# Patient Record
Sex: Female | Born: 1995 | Race: White | Marital: Single | Smoking: Former smoker
Health system: Southern US, Community
[De-identification: ages and names within clinical notes are randomized; demographics above are authoritative.]

## PROBLEM LIST (undated history)

## (undated) HISTORY — PX: NO PAST SURGERIES: SHX2092

---

## 2020-03-13 ENCOUNTER — Other Ambulatory Visit (HOSPITAL_COMMUNITY): Payer: Self-pay | Admitting: Endocrinology

## 2020-03-13 ENCOUNTER — Other Ambulatory Visit: Payer: Self-pay

## 2020-03-13 ENCOUNTER — Ambulatory Visit
Admission: RE | Admit: 2020-03-13 | Discharge: 2020-03-13 | Disposition: A | Discharge status: Home / Self Care | Payer: 59 | Source: Ambulatory Visit | Attending: Family Medicine | Admitting: Family Medicine

## 2020-03-13 ENCOUNTER — Other Ambulatory Visit (HOSPITAL_COMMUNITY): Payer: Self-pay | Admitting: Family Medicine

## 2020-03-13 ENCOUNTER — Other Ambulatory Visit: Payer: Self-pay | Admitting: Family Medicine

## 2020-03-13 DIAGNOSIS — R1031 Right lower quadrant pain: Secondary | ICD-10-CM | POA: Insufficient documentation

## 2020-03-13 DIAGNOSIS — K3580 Unspecified acute appendicitis: Secondary | ICD-10-CM | POA: Diagnosis not present

## 2020-03-13 IMAGING — CT CT ABD-PELV W/ CM
2 of 4 series · 15 of 46 positions shown, 17 images · IV contrast (APPLIED)
Comparison: None.

CLINICAL DATA: Left lower quadrant abdominal pain for 1 month.

EXAM:
CT ABDOMEN AND PELVIS WITH CONTRAST
TECHNIQUE: Multidetector CT imaging of the abdomen and pelvis was performed
using the standard protocol following bolus administration of
intravenous contrast.
CONTRAST:  75mL OMNIPAQUE IOHEXOL 300 MG/ML  SOLN

[Series 2: axial st · axial · 0.61mm/px · z∈[-853,-478]mm · 12 of 83 slices shown, 14 images]
[im 4/83  soft-tissue]
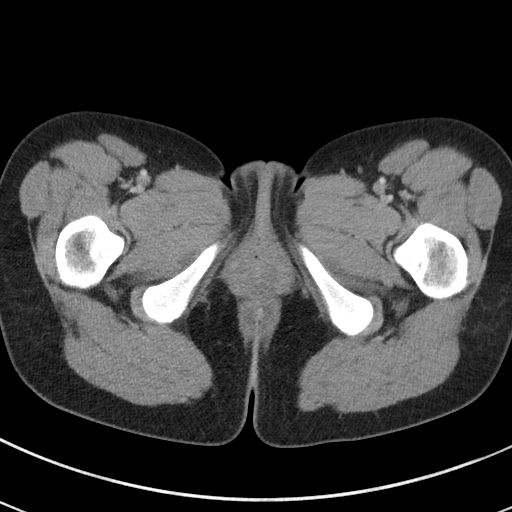
[im 4/83  bone]
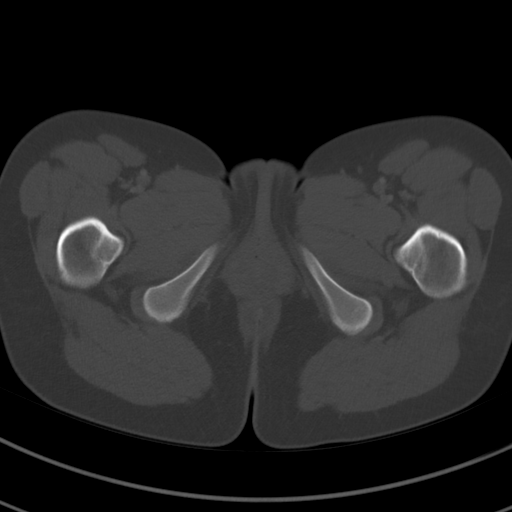
[im 11/83  soft-tissue]
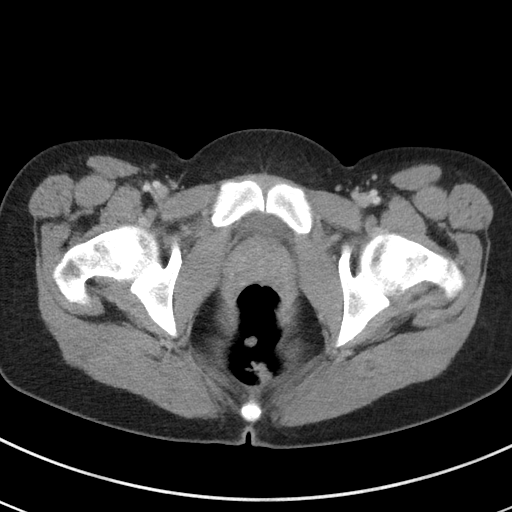
[im 18/83  soft-tissue]
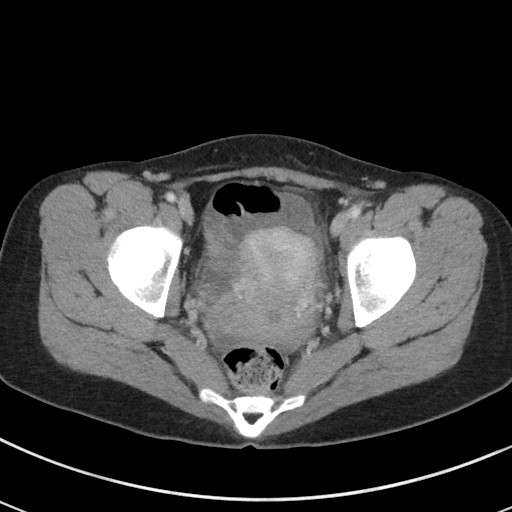
[im 24/83  soft-tissue]
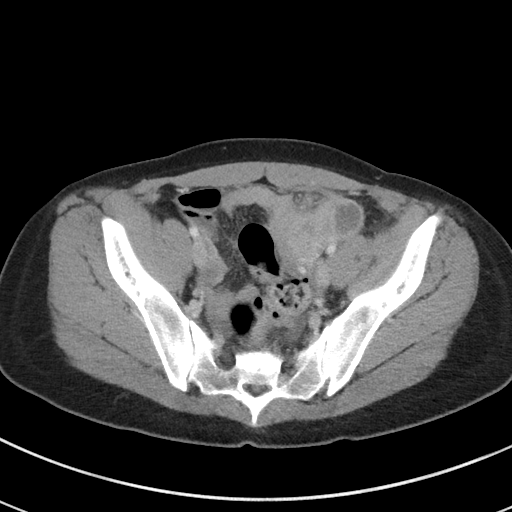
[im 31/83  soft-tissue]
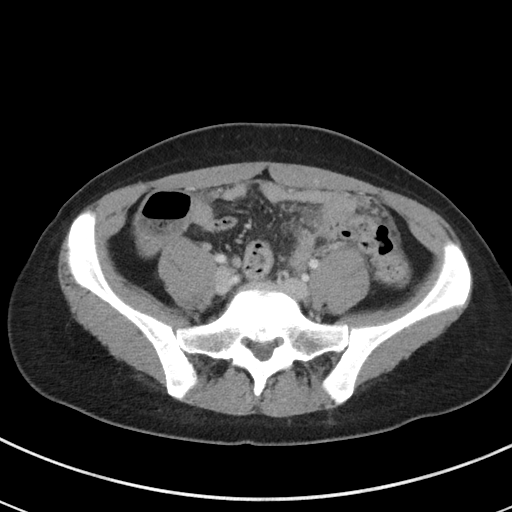
[im 38/83  soft-tissue]
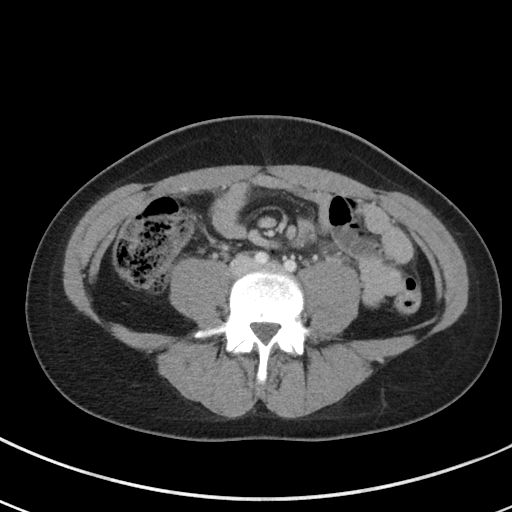
[im 45/83  soft-tissue]
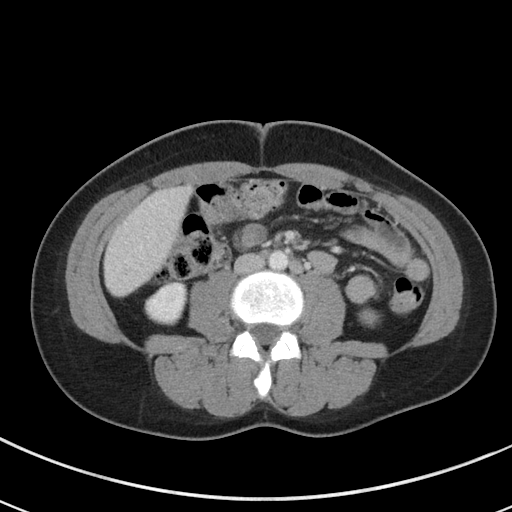
[im 52/83  soft-tissue]
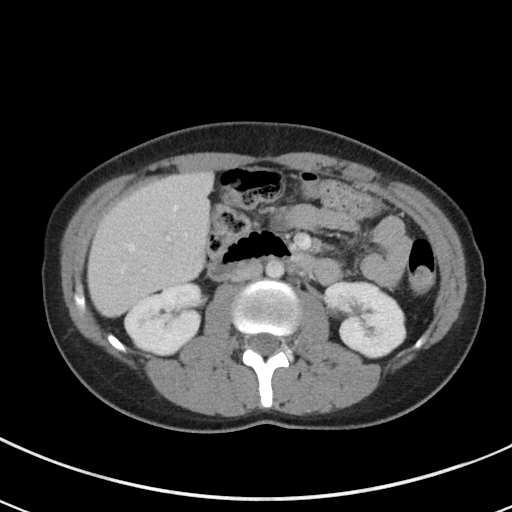
[im 59/83  soft-tissue]
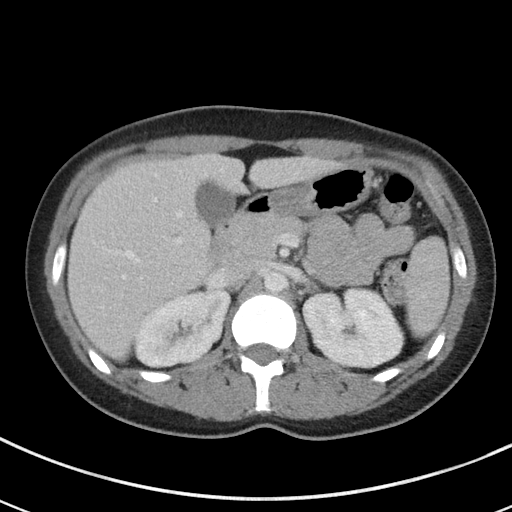
[im 59/83  bone]
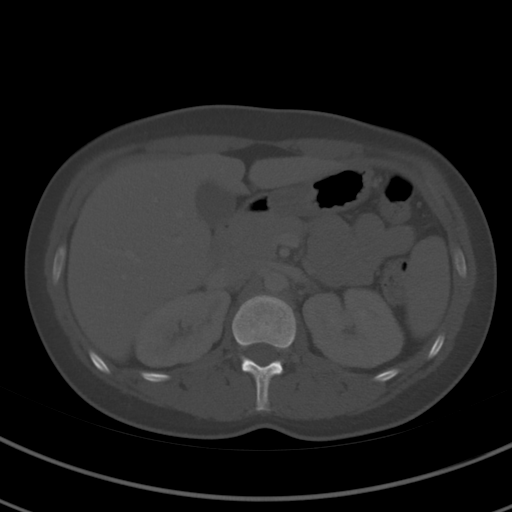
[im 65/83  soft-tissue]
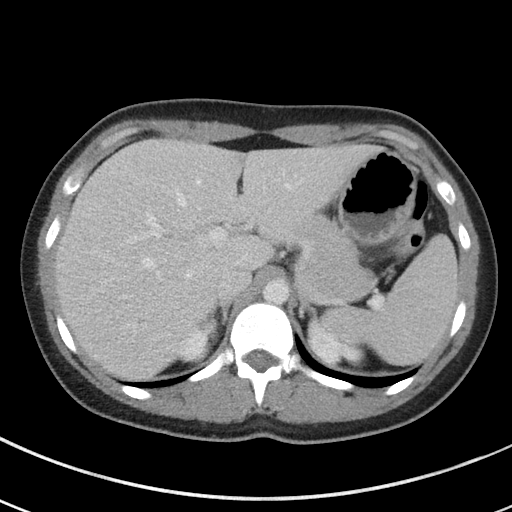
[im 72/83  soft-tissue]
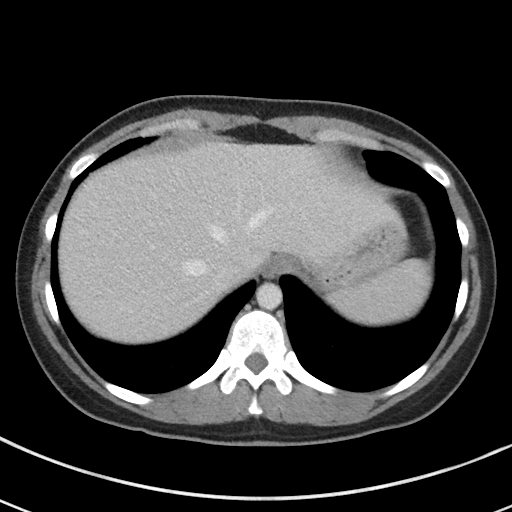
[im 79/83  soft-tissue]
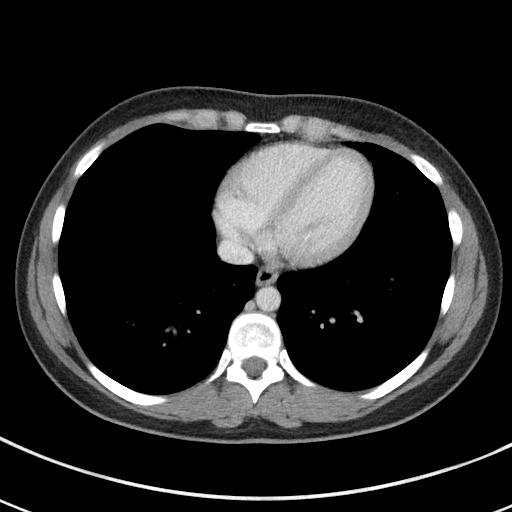

[Series 6: coronal st · coronal · 0.66mm/px · 3 of 75 slices shown]
[im 25/75  soft-tissue]
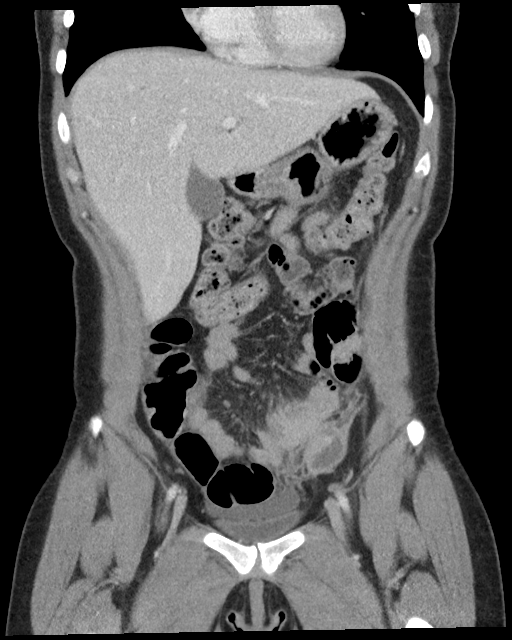
[im 33/75  soft-tissue]
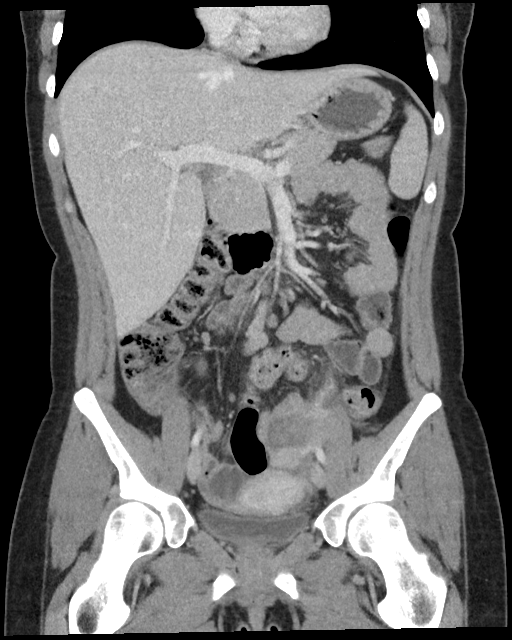
[im 42/75  soft-tissue]
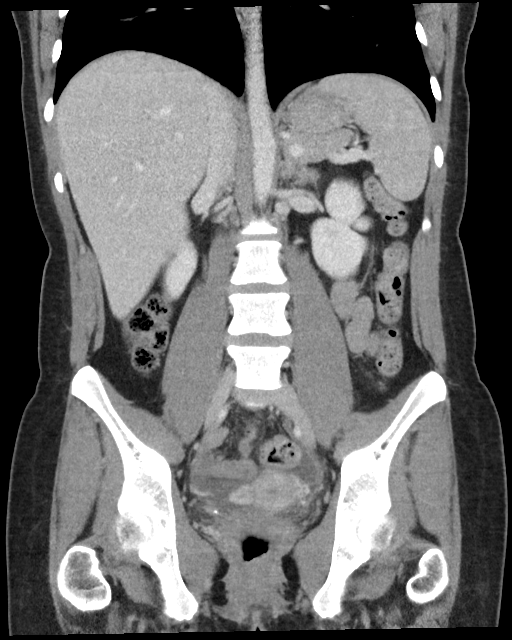

[15 of 46 positions shown; findings below may reference images not displayed]

FINDINGS: Lower chest: The lung bases are clear of acute process. No pleural
effusion or pulmonary lesions. The heart is normal in size. No
pericardial effusion. The distal esophagus and aorta are
unremarkable.

Hepatobiliary: No focal hepatic lesions or intrahepatic biliary
dilatation. The gallbladder is normal. No common bile duct
dilatation.

Pancreas: No mass, inflammation or ductal dilatation.

Spleen: Normal size.  No focal lesions.

Adrenals/Urinary Tract: The adrenal glands are unremarkable.

Small bilateral renal calculi but no obstructing ureteral calculi or
bladder calculi. Both kidneys demonstrate normal
enhancement/perfusion. No worrisome pulmonary lesions. Lobulated
contours of both kidneys likely fetal lobulation.

Stomach/Bowel: The stomach, duodenum, small bowel and colon are
grossly normal without oral contrast. No acute inflammatory changes,
mass lesions or obstructive findings. The terminal ileum is normal.
The appendix is normal.

Vascular/Lymphatic: The aorta is normal in caliber. No dissection.
The branch vessels are patent. The major venous structures are
patent. No mesenteric or retroperitoneal mass or adenopathy. Small
scattered lymph nodes are noted.

Reproductive: The uterus is unremarkable. The right ovary is normal.
Complex serpiginous fluid-filled structure in the left adnexa with
an adjacent irregular low-attenuation lesion measuring a maximum of
3 cm. Findings highly suspicious for hydro/pyosalpinx and
tubo-ovarian abscess. Pelvic ultrasound may be helpful for further
evaluation.

Other: Small amount of free pelvic fluid is slightly hyperdense and
could suggest PID.

Musculoskeletal: No significant bony findings.
IMPRESSION: 1. CT findings highly suspicious for left-sided hydro/pyosalpinx and
tubo-ovarian abscess. Pelvic ultrasound may be helpful for further
evaluation.
2. Small amount of free pelvic fluid is slightly hyperdense and
could suggest PID.
3. Small bilateral renal calculi but no obstructing ureteral calculi
or bladder calculi.

These results will be called to the ordering clinician or
representative by the Radiologist Assistant, and communication
documented in the PACS or [REDACTED].

## 2020-03-13 MED ORDER — IOHEXOL 300 MG/ML  SOLN
75.0000 mL | Freq: Once | INTRAMUSCULAR | Status: AC | PRN
  Administered 2020-03-13: 75 mL via INTRAVENOUS

## 2020-03-17 ENCOUNTER — Other Ambulatory Visit (HOSPITAL_COMMUNITY)
Admission: RE | Admit: 2020-03-17 | Discharge: 2020-03-17 | Disposition: A | Discharge status: Home / Self Care | Payer: 59 | Source: Ambulatory Visit | Attending: Obstetrics & Gynecology | Admitting: Obstetrics & Gynecology

## 2020-03-17 ENCOUNTER — Ambulatory Visit (INDEPENDENT_AMBULATORY_CARE_PROVIDER_SITE_OTHER): Payer: 59

## 2020-03-17 ENCOUNTER — Other Ambulatory Visit: Payer: Self-pay

## 2020-03-17 ENCOUNTER — Encounter: Payer: Self-pay | Admitting: Obstetrics & Gynecology

## 2020-03-17 ENCOUNTER — Ambulatory Visit (INDEPENDENT_AMBULATORY_CARE_PROVIDER_SITE_OTHER): Payer: 59 | Admitting: Obstetrics & Gynecology

## 2020-03-17 VITALS — BP 130/80 | Ht <= 58 in | Wt 113.0 lb

## 2020-03-17 DIAGNOSIS — N838 Other noninflammatory disorders of ovary, fallopian tube and broad ligament: Secondary | ICD-10-CM | POA: Insufficient documentation

## 2020-03-17 DIAGNOSIS — Z124 Encounter for screening for malignant neoplasm of cervix: Secondary | ICD-10-CM | POA: Diagnosis not present

## 2020-03-17 DIAGNOSIS — R1032 Left lower quadrant pain: Secondary | ICD-10-CM | POA: Diagnosis not present

## 2020-03-17 DIAGNOSIS — Z113 Encounter for screening for infections with a predominantly sexual mode of transmission: Secondary | ICD-10-CM

## 2020-03-17 MED ORDER — CEFTRIAXONE SODIUM 250 MG IJ SOLR
250.0000 mg | Freq: Once | INTRAMUSCULAR | Status: AC
Start: 2020-03-17 — End: 2020-03-17
  Administered 2020-03-17: 250 mg via INTRAMUSCULAR

## 2020-03-17 MED ORDER — DOXYCYCLINE HYCLATE 100 MG PO CAPS: 100.0000 mg | ORAL_CAPSULE | Freq: Two times a day (BID) | ORAL | 0 refills | Status: AC

## 2020-03-17 NOTE — Addendum Note (Signed)
Addended by: Nadara Mustard on: 03/17/2020 11:51 AM   Modules accepted: Level of Service

## 2020-03-17 NOTE — Progress Notes (Signed)
Consultant NP Mayford Knife Reason: Mass  HPI: Tamara Ramsey is a 24 y.o. G0P0000 who LMP was Tamara Ramsey's last menstrual period was 02/15/2020., presents today for a problem visit.  She complains of recent findings of Left adnexal mass by CT - Pelvis.  Pt has had symptoms of pain.  Pain began late Sept, and has been LLQ with some radiation to lower back and worsened with activity, in fact has missed some work because of this. She had sore throat at onset of pain as well.  She has had irregular bleeding throughout these last 6 weeks, usually reg on OCP.  She has had frequency and dysuria.  She was treated for UTI, later again as did not improve w a second ABX.  Denies vaginal sx's of discharge, itch, odor, or burning.  She has been sexually active for 3 mos w new partner.  No other concerns.  No prior GYN concerns.  Previous evaluation: office visits and treatments for UTI, then w recent UA neg and still having pain, a CT was done.  Prior Diagnosis: left sided abnormality.    CT: Reproductive: The uterus is unremarkable. The right ovary is normal. Complex serpiginous fluid-filled structure in the left adnexa with an adjacent irregular low-attenuation lesion measuring a maximum of 3 cm. Findings highly suspicious for hydro/pyosalpinx and tubo-ovarian abscess. Pelvic ultrasound may be helpful for further evaluation. Other: Small amount of free pelvic fluid is slightly hyperdense and could suggest PID.  Previous Treatment: ABX for UTI.  PMHx: She  has no past medical history on file. Also,  has no past surgical history on file., family history is not on file.,  reports that she has never smoked. She has never used smokeless tobacco. She reports that she does not drink alcohol and does not use drugs.  She has a current medication list which includes the following prescription(s): citalopram, tri-sprintec, and doxycycline, and the following Facility-Administered Medications: ceftriaxone. Also, is allergic to  augmentin [amoxicillin-pot clavulanate] and sulfa antibiotics.  Review of Systems  Constitutional: Negative for chills, fever and malaise/fatigue.  HENT: Negative for congestion, sinus pain and sore throat.   Eyes: Negative for blurred vision and pain.  Respiratory: Negative for cough and wheezing.   Cardiovascular: Negative for chest pain and leg swelling.  Gastrointestinal: Positive for abdominal pain. Negative for constipation, diarrhea, heartburn, nausea and vomiting.  Genitourinary: Positive for frequency. Negative for dysuria, hematuria and urgency.  Musculoskeletal: Negative for back pain, joint pain, myalgias and neck pain.  Skin: Negative for itching and rash.  Neurological: Negative for dizziness, tremors and weakness.  Endo/Heme/Allergies: Does not bruise/bleed easily.  Psychiatric/Behavioral: Negative for depression. The Tamara Ramsey is not nervous/anxious and does not have insomnia.     Objective: BP 130/80   Ht 4\' 9"  (1.448 m)   Wt 113 lb (51.3 kg)   LMP 02/15/2020   BMI 24.45 kg/m  Physical Exam Constitutional:      General: She is not in acute distress.    Appearance: She is well-developed.  Genitourinary:     Pelvic exam was performed with Tamara Ramsey supine.     Vagina and uterus normal.     No vaginal erythema or bleeding.     No cervical motion tenderness, discharge, polyp or nabothian cyst.     Uterus is mobile.     Uterus is not enlarged.     No uterine mass detected.    Uterus is midaxial.     No right or left adnexal mass present.  Right adnexa not tender.     Left adnexa not tender.  HENT:     Head: Normocephalic and atraumatic.     Nose: Nose normal.  Abdominal:     General: There is no distension.     Palpations: Abdomen is soft.     Tenderness: There is no abdominal tenderness.  Musculoskeletal:        General: Normal range of motion.  Neurological:     Mental Status: She is alert and oriented to person, place, and time.     Cranial Nerves: No  cranial nerve deficit.  Skin:    General: Skin is warm and dry.  Psychiatric:        Attention and Perception: Attention normal.        Mood and Affect: Mood and affect normal.        Speech: Speech normal.        Behavior: Behavior normal.        Thought Content: Thought content normal.        Judgment: Judgment normal.     ASSESSMENT/PLAN:    Problem List Items Addressed This Visit    LLQ pain      Left tubo-ovarian mass       Relevant Orders   Cervicovaginal ancillary only   US PELVIC COMPLETE WITH TRANSVAGINAL   Screening for cervical cancer       Relevant Orders   Cytology - PAP   Screen for STD (sexually transmitted disease)       Relevant Orders   CBC   STD Panel    Will obtain US to characterize left adnexal mass.  PID- will treat as possibility.  Rocephin today.  Doxycycline for 2 weeks.    Even if testing today neg for GC/Chl, counseled pt could still be PID ascending to out of range to test for.  If [place] c/w TOA, then cont ABX and surgery if pain worsens or if mass enlarges w subsequent imaging.  MASS- if not PID, then moniutor for resolution and consider surgery if pain worsens or mass does not resolve.  Annamarie Major, MD, [facility], [facility] 03/17/2020  9:41 AM

## 2020-03-17 NOTE — Patient Instructions (Signed)
Transvaginal Ultrasound A transvaginal ultrasound, also called an endovaginal ultrasound, is a test that uses sound waves to take pictures of the female genital tract. The pictures are taken with a device, called a transducer, that is placed in the vagina. This test may be done to:  Check for problems with your pregnancy.  Check your developing baby.  Check for anything abnormal in the uterus or ovaries.  Find out why you have pelvic pain or bleeding. Tell a health care provider about:  Any allergies you have.  All medicines you are taking, including vitamins, herbs, eye drops, creams, and over-the-counter medicines.  Any blood disorders you have.  Any surgeries you have had.  Any medical conditions you have.  Whether you are pregnant or may be pregnant.  Whether you are having your menstrual period. What are the risks? This is a safe procedure. There are no known risks or complications of having this test. What happens before the procedure? This procedure needs to be done when your bladder is empty. Follow your health care provider's instructions about drinking fluids and emptying your bladder before the test. What happens during the procedure?   You will empty your bladder before the procedure.  You will undress from the waist down.  You will lie down on an exam table, with your knees bent and your feet in foot holders.  A health care provider will cover the transducer with a sterile cover.  A gel will be put on the transducer. The gel helps transmit the sound waves and prevents irritation of your vagina.  The technician will insert the transducer into your vagina to get images. These will be displayed on a monitor that looks like a small television screen.  The transducer will be removed when the procedure is complete. The procedure may vary among health care providers and hospitals. What happens after the procedure?  It is up to you to get the results of your  procedure. Ask your health care provider, or the department that is doing the procedure, when your results will be ready.  Keep all follow-up visits as told by your health care provider. This is important. Summary  A transvaginal ultrasound, also called an endovaginal ultrasound, is a test that uses sound waves to take pictures of the female genital tract.  This is a safe procedure. There are no known risks associated with this test.  The procedure needs to be done when your bladder is empty. Follow your health care provider's instructions about drinking fluids and emptying your bladder before the test.  During the procedure, you will undress from the waist down and lie down on an exam table. A technician will insert a transducer into your vagina to obtain images.  Ask your health care provider, or the department that is doing the procedure, when your results will be ready. This information is not intended to replace advice given to you by your health care provider. Make sure you discuss any questions you have with your health care provider. Document Revised: 12/13/2017 Document Reviewed: 12/13/2017 Elsevier Patient Education  2020 Elsevier Inc.  

## 2020-03-18 ENCOUNTER — Other Ambulatory Visit: Payer: Self-pay | Admitting: Obstetrics & Gynecology

## 2020-03-18 ENCOUNTER — Telehealth: Payer: Self-pay | Admitting: Obstetrics & Gynecology

## 2020-03-18 DIAGNOSIS — N838 Other noninflammatory disorders of ovary, fallopian tube and broad ligament: Secondary | ICD-10-CM

## 2020-03-18 LAB — CERVICOVAGINAL ANCILLARY ONLY
Bacterial Vaginitis (gardnerella): POSITIVE — AB
Candida Glabrata: NEGATIVE
Candida Vaginitis: NEGATIVE
Chlamydia: POSITIVE — AB
Comment: NEGATIVE
Comment: NEGATIVE
Comment: NEGATIVE
Comment: NEGATIVE
Comment: NEGATIVE
Comment: NORMAL
Neisseria Gonorrhea: NEGATIVE
Trichomonas: NEGATIVE

## 2020-03-18 NOTE — Telephone Encounter (Signed)
Patient is calling for results. Please advise  °

## 2020-03-18 NOTE — Progress Notes (Signed)
Discussed [place] results w patient. Likely TOA, Likely from STI. Cont Doxyclycine Have partner tested/treated Keep f/u appts. Plan [place] f/u 2 weeks Surgery considered if worsening pain, fevers, or if this mass does not resolve itself.  Annamarie Major, MD, [facility], [facility] 03/18/2020  9:59 AM

## 2020-03-19 LAB — RPR+HSVIGM+HBSAG+HSV2(IGG)+...
HIV Screen 4th Generation wRfx: NONREACTIVE
HSV 2 IgG, Type Spec: <0.91 index (ref 0.00–0.90)
HSVI/II Comb IgM: <0.91 Ratio (ref 0.00–0.90)
Hepatitis B Surface Ag: NEGATIVE
RPR Ser Ql: NONREACTIVE

## 2020-03-19 LAB — CBC
Hematocrit: 34.7 % (ref 34.0–46.6)
Hemoglobin: 11.1 g/dL (ref 11.1–15.9)
MCH: 28.9 pg (ref 26.6–33.0)
MCHC: 32 g/dL (ref 31.5–35.7)
MCV: 90 fL (ref 79–97)
Platelets: 447 10*3/uL (ref 150–450)
RBC: 3.84 x10E6/uL (ref 3.77–5.28)
RDW: 11.8 % (ref 11.7–15.4)
WBC: 10.5 10*3/uL (ref 3.4–10.8)

## 2020-03-19 LAB — CYTOLOGY - PAP
Chlamydia: POSITIVE — AB
Comment: NEGATIVE
Comment: NORMAL
Neisseria Gonorrhea: NEGATIVE

## 2020-03-23 ENCOUNTER — Other Ambulatory Visit: Payer: Self-pay

## 2020-03-23 ENCOUNTER — Other Ambulatory Visit: Payer: Self-pay | Admitting: Obstetrics & Gynecology

## 2020-03-23 ENCOUNTER — Ambulatory Visit (INDEPENDENT_AMBULATORY_CARE_PROVIDER_SITE_OTHER): Payer: 59

## 2020-03-23 ENCOUNTER — Encounter: Payer: 59 | Admitting: Obstetrics and Gynecology

## 2020-03-23 DIAGNOSIS — N838 Other noninflammatory disorders of ovary, fallopian tube and broad ligament: Secondary | ICD-10-CM

## 2020-03-24 ENCOUNTER — Ambulatory Visit (INDEPENDENT_AMBULATORY_CARE_PROVIDER_SITE_OTHER): Payer: 59 | Admitting: Obstetrics & Gynecology

## 2020-03-24 ENCOUNTER — Encounter: Payer: Self-pay | Admitting: Obstetrics & Gynecology

## 2020-03-24 VITALS — BP 120/80 | Ht <= 58 in | Wt 112.0 lb

## 2020-03-24 DIAGNOSIS — R1032 Left lower quadrant pain: Secondary | ICD-10-CM | POA: Diagnosis not present

## 2020-03-24 DIAGNOSIS — N838 Other noninflammatory disorders of ovary, fallopian tube and broad ligament: Secondary | ICD-10-CM | POA: Diagnosis not present

## 2020-03-24 NOTE — Progress Notes (Signed)
  HPI: Pt is a 24 yo Go with recent chlamydia and TOA dx and tx, doing much better and w no LLQ pain or other sx's.  BF has laso been treated (he tested neg).  SHe is still on her 2 weeks of daily Doxy.  Denies fever, d/c, pain.  Ultrasound demonstrates no masses seen, resolved area on left adnexa from prior scan These findings are showing improving TOA managemenmt.  PMHx: She  has no past medical history on file. Also,  has no past surgical history on file., family history is not on file.,  reports that she has never smoked. She has never used smokeless tobacco. She reports that she does not drink alcohol and does not use drugs.  She has a current medication list which includes the following prescription(s): citalopram, doxycycline, and tri-sprintec. Also, is allergic to augmentin [amoxicillin-pot clavulanate] and sulfa antibiotics.  Review of Systems  All other systems reviewed and are negative.   Objective: BP 120/80   Ht 4\' 9"  (1.448 m)   Wt 112 lb (50.8 kg)   LMP 03/17/2020   BMI 24.24 kg/m   Physical examination Constitutional NAD, Conversant  Skin No rashes, lesions or ulceration.   Extremities: Moves all appropriately.  Normal ROM for age. No lymphadenopathy.  Neuro: Grossly intact  Psych: Oriented to PPT.  Normal mood. Normal affect.   13/06/2019 PELVIS TRANSVAGINAL NON-OB (TV ONLY)  Result Date: 03/23/2020 Patient Name: Tamara Ramsey DOB: February 01, 1996 MRN: [id number] ULTRASOUND REPORT Location: [facility] Date of Service: 03/23/2020 Indications: history of tubo-ovarian abscess, follow up ultrasound Findings: The uterus is anteverted and measures 6.9 x 4.5 x 3.2 cm. Echo texture is homogenous without evidence of focal masses. The Endometrium measures 1.1 mm. There is some evidence of fluid within the endometrial cavity, small volume. Right Ovary measures 3.4 x 1.7 x 1.5 cm. It is normal in appearance. Left Ovary measures 4.6 x 2.7 x 2.2 cm. It is normal in appearance. Survey of the  adnexa demonstrates no adnexal masses. There is no free fluid in the cul de sac. Impression: 1. Normal pelvic ultrasound. 2. Ultrasound today does not correlate with CT findings. 13/12/2019, RT The ultrasound images and findings were reviewed by me and I agree with the above report. Deanna Artis, MD, [facility], [facility] 03/23/2020 6:31 PM      Assessment:  Left tubo-ovarian mass LLQ pain  Plan:  Finish ABC Monitor for recurrence Safe sex practices discussed  Also discussed recent LGSIL PAP Sch Colpo in near future  A total of 20 minutes were spent face-to-face with the patient as well as preparation, review, communication, and documentation during this encounter.   13/12/2019, MD, [facility]/G[facility], [facility] 03/24/2020  8:55 AM

## 2020-03-24 NOTE — Patient Instructions (Signed)
Colposcopy Colposcopy is a procedure to examine the lowest part of the uterus (cervix), the vagina, and the area around the vaginal opening (vulva) for abnormalities or signs of disease. The procedure is done using a lighted microscope or magnifying lens (colposcope). If any unusual cells are found during the procedure, your health care provider may remove a tissue sample for testing (biopsy). A colposcopy may be done if you:  Have an abnormal Pap test. A Pap test is a screening test that is used to check for signs of cancer or infection of the vagina, cervix, and uterus.  Have a Pap smear test in which you test positive for high-risk HPV (human papillomavirus).  Have a sore or lesion on your cervix.  Have genital warts on your vulva, vagina, or cervix.  Took certain medicines while pregnant, such as diethylstilbestrol (DES).  Have pain during sexual intercourse.  Have vaginal bleeding, especially after sexual intercourse.  Need to have a cervical polyp removed.  Need to have a lost intrauterine device (IUD) string located. Let your health care provider know about:  Any allergies you have, including allergies to prescribed medicine, latex, or iodine.  All medicines you are taking, including vitamins, herbs, eye drops, creams, and over-the-counter medicines. Bring a list of all of your medicines to your appointment.  Any problems you or family members have had with anesthetic medicines.  Any blood disorders you have.  Any surgeries you have had.  Any medical conditions you have, such as pelvic inflammatory disease (PID) or endometrial disorder.  Any history of frequent fainting.  Your menstrual cycle and what form of birth control (contraception) you use.  Your medical history, including any prior cervical treatment.  Whether you are pregnant or may be pregnant. What are the risks? Generally, this is a safe procedure. However, problems may occur,  including:  Pain.  Infection, which may include a fever, bad-smelling discharge, or pelvic pain.  Bleeding or discharge.  Misdiagnosis.  Fainting and vasovagal reactions, but this is rare.  Allergic reactions to medicines.  Damage to other structures or organs. What happens before the procedure?  If you have your menstrual period or will have it at the time of your procedure, tell your health care provider. A colposcopy typically is not done during menstruation.  Continue your contraceptive practices before and after the procedure.  For 24 hours before the colposcopy: ? Do not douche. ? Do not use tampons. ? Do not use medicines, creams, or suppositories in the vagina. ? Do not have sexual intercourse.  Ask your health care provider about: ? Changing or stopping your regular medicines. This is especially important if you are taking diabetes medicines or blood thinners. ? Taking medicines such as aspirin and ibuprofen. These medicines can thin your blood. Do not take these medicines before your procedure if your health care provider instructs you not to. It is likely that your health care provider will tell you to avoid taking aspirin or medicine that contains aspirin for 7 days before the procedure.  Follow instructions from your health care provider about eating or drinking restrictions. You will likely need to eat a regular diet the day of the procedure and not skip any meals.  You may have an exam or testing. A pregnancy test will be taken on the day of the procedure.  You may have a blood or urine sample taken.  Plan to have someone take you home from the hospital or clinic.  If you will be going   home right after the procedure, plan to have someone with you for 24 hours. What happens during the procedure?  You will lie down on your back, with your feet in foot rests (stirrups).  A warmed and lubricated instrument (speculum) will be inserted into your vagina. The  speculum will be used to hold apart the walls of your vagina so your health care provider can see your cervix and the inside of your vagina.  A cotton swab will be used to place a small amount of liquid solution on the areas to be examined. This solution makes it easier to see abnormal cells. You may feel a slight burning during this part.  The colposcope will be used to scan the cervix with a bright white light. The colposcope will be held near your vulvaand will magnify your vulva, vagina, and cervix for easier examination.  Your health care provider may decide to take a biopsy. If so: ? You may be given medicine to numb the area (local anesthetic). ? Surgical instruments will be used to suck out mucus and cells through your vagina. ? You may feel mild pain while the tissue sample is removed. ? Bleeding may occur. A solution may be used to stop the bleeding. ? If a sample of tissue is needed from the inside of the cervix, a different procedure called endocervical curettage (ECC) may be completed. During this procedure, a curved instrument (curette) will be used to scrape cells from your cervix or the top of your cervix (endocervix).  Your health care provider will record the location of any abnormalities. The procedure may vary among health care providers and hospitals. What happens after the procedure?  You will lie down and rest for a few minutes. You may be offered juice or cookies.  Your blood pressure, heart rate, breathing rate, and blood oxygen level will be monitored until any medicines you were given have worn off.  You may have to wear compression stockings. These stockings help to prevent blood clots and reduce swelling in your legs.  You may have some cramping in your abdomen. This should go away after a few minutes. This information is not intended to replace advice given to you by your health care provider. Make sure you discuss any questions you have with your health care  provider. Document Revised: 04/14/2017 Document Reviewed: 12/07/2015 Elsevier Patient Education  2020 Elsevier Inc.  

## 2022-03-04 ENCOUNTER — Ambulatory Visit: Payer: 59 | Admitting: Advanced Practice Midwife

## 2022-04-19 ENCOUNTER — Other Ambulatory Visit (HOSPITAL_COMMUNITY)
Admission: RE | Admit: 2022-04-19 | Discharge: 2022-04-19 | Disposition: A | Discharge status: Home / Self Care | Payer: 59 | Source: Ambulatory Visit | Attending: Family | Admitting: Family

## 2022-04-19 ENCOUNTER — Ambulatory Visit: Payer: 59 | Admitting: Family

## 2022-04-19 ENCOUNTER — Encounter: Payer: Self-pay | Admitting: Family

## 2022-04-19 VITALS — BP 122/78 | HR 85 | Temp 98.4°F | Resp 16 | Ht <= 58 in | Wt 127.1 lb

## 2022-04-19 DIAGNOSIS — Z124 Encounter for screening for malignant neoplasm of cervix: Secondary | ICD-10-CM | POA: Insufficient documentation

## 2022-04-19 DIAGNOSIS — F411 Generalized anxiety disorder: Secondary | ICD-10-CM | POA: Diagnosis not present

## 2022-04-19 DIAGNOSIS — J301 Allergic rhinitis due to pollen: Secondary | ICD-10-CM

## 2022-04-19 DIAGNOSIS — Z113 Encounter for screening for infections with a predominantly sexual mode of transmission: Secondary | ICD-10-CM | POA: Diagnosis present

## 2022-04-19 DIAGNOSIS — Z0001 Encounter for general adult medical examination with abnormal findings: Secondary | ICD-10-CM | POA: Diagnosis not present

## 2022-04-19 DIAGNOSIS — J309 Allergic rhinitis, unspecified: Secondary | ICD-10-CM | POA: Insufficient documentation

## 2022-04-19 DIAGNOSIS — Z3041 Encounter for surveillance of contraceptive pills: Secondary | ICD-10-CM | POA: Insufficient documentation

## 2022-04-19 DIAGNOSIS — Z8742 Personal history of other diseases of the female genital tract: Secondary | ICD-10-CM

## 2022-04-19 DIAGNOSIS — R87612 Low grade squamous intraepithelial lesion on cytologic smear of cervix (LGSIL): Secondary | ICD-10-CM | POA: Diagnosis present

## 2022-04-19 MED ORDER — CITALOPRAM HYDROBROMIDE 20 MG PO TABS: 20.0000 mg | ORAL_TABLET | Freq: Every day | ORAL | 3 refills | Status: AC

## 2022-04-19 MED ORDER — TRI-SPRINTEC 0.18/0.215/0.25 MG-35 MCG PO TABS: 1.0000 | ORAL_TABLET | Freq: Every day | ORAL | 11 refills | Status: DC

## 2022-04-19 NOTE — Patient Instructions (Signed)
  Stop by the lab prior to leaving today. I will notify you of your results once received.   Recommendations on keeping yourself healthy:  - Exercise at least 30-45 minutes a day, 3-4 days a week.  - Eat a low-fat diet with lots of fruits and vegetables, up to 7-9 servings per day.  - Seatbelts can save your life. Wear them always.  - Smoke detectors on every level of your home, check batteries every year.  - Eye Doctor - have an eye exam every 1-2 years  - Safe sex - if you may be exposed to STDs, use a condom.  - Alcohol -  If you drink, do it moderately, less than 2 drinks per day.  - Health Care Power of Attorney. Choose someone to speak for you if you are not able.  - Depression is common in our stressful world.If you're feeling down or losing interest in things you normally enjoy, please come in for a visit.  - Violence - If anyone is threatening or hurting you, please call immediately.  Due to recent changes in healthcare laws, you may see results of your imaging and/or laboratory studies on MyChart before I have had a chance to review them.  I understand that in some cases there may be results that are confusing or concerning to you. Please understand that not all results are received at the same time and often I may need to interpret multiple results in order to provide you with the best plan of care or course of treatment. Therefore, I ask that you please give me 2 business days to thoroughly review all your results before contacting my office for clarification. Should we see a critical lab result, you will be contacted sooner.   I will see you again in one year for your annual comprehensive exam unless otherwise stated and or with acute concerns.  It was a pleasure seeing you today! Please do not hesitate to reach out with any questions and or concerns.  Regards,   Dallas Scorsone    

## 2022-04-19 NOTE — Progress Notes (Signed)
Established Patient Office Visit  Subjective:  Patient ID: Tamara Ramsey, female    DOB: August 26, 1995  Age: 26 y.o. MRN: [id number]  CC:  Chief Complaint  Patient presents with   Establish Care    HPI Khalil Shaheed is here today for an well woman exam as well as to establish care.  Prior provider was Dr. Geroge Baseman in [facility].  Last pap with Dr. Teresa Coombs, abn.   Pt is with acute concerns.  9/21 with LSIL   History reviewed. No pertinent past medical history.  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  Tobacco Use   Smoking status: Former    Types: Cigarettes    Quit date: 2015    Years since quitting: 8.9   Smokeless tobacco: Never  Vaping Use   Vaping Use: Every day   Substances: Nicotine  Substance and Sexual Activity   Alcohol use: Yes    Comment: occ   Drug use: Never   Sexual activity: Yes    Partners: Male    Birth control/protection: Pill, OCP  Other Topics Concern   Not on file  Social History Narrative   Not on file   Social Determinants of Health   Financial Resource Strain: Not on file  Food Insecurity: Not on file  Transportation Needs: Not on file  Physical Activity: Not on file  Stress: Not on file  Social Connections: Not on file  Intimate Partner Violence: Not on file    Outpatient Medications Prior to Visit  Medication Sig Dispense Refill   fluticasone (FLONASE) 50 MCG/ACT nasal spray Place 1 spray into both nostrils daily.     citalopram (CELEXA) 20 MG tablet Take 20 mg by mouth daily.     TRI-SPRINTEC 0.18/0.215/0.25 MG-35 MCG tablet Take 1 tablet by mouth  daily.     No facility-administered medications prior to visit.    Allergies  Allergen Reactions   Augmentin [Amoxicillin-Pot Clavulanate] Rash   Sulfa Antibiotics Rash    ROS Review of Systems  Review of Systems   Breasts: negative for nipple discharge, breast pain, breast mass, nipple changes, breast rash. Genitourinary:  Negative for decreased urine volume, difficulty urinating, dyspareunia, dysuria, flank pain, menstrual problem, pelvic pain, urgency, vaginal bleeding, vaginal discharge and vaginal pain.  Psychiatric/Behavioral:  Negative for depression and suicidal ideas.   All other systems reviewed and are negative.    Objective:    Physical Exam  Gen: NAD, resting comfortably Breasts: breasts appear normal, no suspicious masses, no skin or nipple changes or axillary nodes Physical Exam Genitourinary:    General: Normal vulva.     Pubic Area: No rash.      Labia:        Right: No rash, tenderness, lesion or injury.        Left: No rash, tenderness, lesion or injury.      Urethra: No prolapse or urethral pain.     Vagina: Normal. No vaginal discharge, tenderness or lesions.     Cervix: No discharge.     Rectum:  Normal.  Psych: Normal affect and thought content  BP 122/78   Pulse 85   Temp 98.4 F (36.9 C)   Resp 16   Ht 4' 10" (1.473 m)   Wt 127 lb 2 oz (57.7 kg)   LMP 04/14/2022 (Approximate)   SpO2 98%   BMI 26.57 kg/m  Wt Readings from Last 3 Encounters:  04/19/22 127 lb 2 oz (57.7 kg)  03/24/20 112 lb (50.8 kg)  03/17/20 113 lb (51.3 kg)     Health Maintenance Due  Topic Date Due   Hepatitis C Screening  Never done    There are no preventive care reminders to display for this patient.  No results found for: "TSH" Lab Results  Component Value Date   WBC 10.5 03/17/2020   HGB 11.1 03/17/2020   HCT 34.7 03/17/2020   MCV 90 03/17/2020   PLT 447 03/17/2020   No results found for: "NA", "K", "CHLORIDE", "CO2", "GLUCOSE", "BUN",  "CREATININE", "BILITOT", "ALKPHOS", "AST", "ALT", "PROT", "ALBUMIN", "CALCIUM", "ANIONGAP", "EGFR", "GFR" No results found for: "CHOL" No results found for: "HDL" No results found for: "LDLCALC" No results found for: "TRIG" No results found for: "CHOLHDL" No results found for: "HGBA1C"    Assessment & Plan:   Problem List Items Addressed This Visit       Respiratory   Allergic rhinitis    Continue flonase 50 mcg once daily         Other   GAD (generalized anxiety disorder) - Primary    Refill celexa 20 mg  stable      Relevant Medications   citalopram (CELEXA) 20 MG tablet   Screening for cervical cancer    Pt verbalized consent for pelvic exam Std testing ordered today hpv thin prep ordered and pending results Pap exam in office completed, pt tolerated well.        Relevant Orders   Cytology - PAP   Encounter for general adult medical examination with abnormal findings    Patient Counseling(The following topics were reviewed):  Preventative care handout given to pt  Health maintenance and immunizations reviewed. Please refer to Health maintenance section. Pt advised on safe sex, wearing seatbelts in car, and proper nutrition labwork ordered today for annual Dental health: Discussed importance of regular tooth brushing, flossing, and dental visits.       Relevant Orders   CBC   Basic metabolic panel   Lipid panel   LGSIL on Pap smear of cervix    Repeat pap today pending results.        Relevant Orders   Cytology - PAP   History of PID   Relevant Orders   Cytology - PAP   Encounter for surveillance of contraceptive pills   Relevant Medications   TRI-SPRINTEC 0.18/0.215/0.25 MG-35 MCG tablet    Meds ordered this encounter  Medications   TRI-SPRINTEC 0.18/0.215/0.25 MG-35 MCG tablet    Sig: Take 1 tablet by mouth daily.    Dispense:  28 tablet    Refill:  11   citalopram (CELEXA) 20 MG tablet    Sig: Take 1 tablet (20 mg total) by mouth daily.     Dispense:  90 tablet    Refill:  3    Follow-up: Return in about 1 year (around 04/20/2023) for f/u CPE.    Eugenia Pancoast, FNP

## 2022-04-19 NOTE — Assessment & Plan Note (Signed)
Patient Counseling(The following topics were reviewed): ? Preventative care handout given to pt  ?Health maintenance and immunizations reviewed. Please refer to Health maintenance section. ?Pt advised on safe sex, wearing seatbelts in car, and proper nutrition ?labwork ordered today for annual ?Dental health: Discussed importance of regular tooth brushing, flossing, and dental visits. ? ? ?

## 2022-04-19 NOTE — Assessment & Plan Note (Signed)
Repeat pap today pending results 

## 2022-04-19 NOTE — Assessment & Plan Note (Signed)
Refill celexa 20 mg  stable

## 2022-04-19 NOTE — Assessment & Plan Note (Signed)
Pt verbalized consent for pelvic exam Std testing ordered today hpv thin prep ordered and pending results Pap exam in office completed, pt tolerated well.

## 2022-04-19 NOTE — Assessment & Plan Note (Signed)
Continue flonase 50 mcg once daily 

## 2022-04-20 LAB — LIPID PANEL
Cholesterol: 185 mg/dL (ref 0–200)
HDL: 50 mg/dL (ref >39.00)
NonHDL: 134.6
Total CHOL/HDL Ratio: 4
Triglycerides: 330 mg/dL — ABNORMAL HIGH (ref 0.0–149.0)
VLDL: 66 mg/dL — ABNORMAL HIGH (ref 0.0–40.0)

## 2022-04-20 LAB — BASIC METABOLIC PANEL
BUN: 10 mg/dL (ref 6–23)
CO2: 26 mEq/L (ref 19–32)
Calcium: 9.5 mg/dL (ref 8.4–10.5)
Chloride: 101 mEq/L (ref 96–112)
Creatinine, Ser: 0.75 mg/dL (ref 0.40–1.20)
GFR: 109.07 mL/min (ref >60.00)
Glucose, Bld: 86 mg/dL (ref 70–99)
Potassium: 3.7 mEq/L (ref 3.5–5.1)
Sodium: 137 mEq/L (ref 135–145)

## 2022-04-20 LAB — CBC
HCT: 38 % (ref 36.0–46.0)
Hemoglobin: 13 g/dL (ref 12.0–15.0)
MCHC: 34.2 g/dL (ref 30.0–36.0)
MCV: 89.7 fl (ref 78.0–100.0)
Platelets: 381 10*3/uL (ref 150.0–400.0)
RBC: 4.24 Mil/uL (ref 3.87–5.11)
RDW: 12.2 % (ref 11.5–15.5)
WBC: 9.7 10*3/uL (ref 4.0–10.5)

## 2022-04-20 LAB — HIV ANTIBODY (ROUTINE TESTING W REFLEX): HIV 1&2 Ab, 4th Generation: NONREACTIVE

## 2022-04-20 LAB — LDL CHOLESTEROL, DIRECT: Direct LDL: 95 mg/dL

## 2022-04-20 LAB — HEPATITIS C ANTIBODY: Hepatitis C Ab: NONREACTIVE

## 2022-04-20 LAB — RPR: RPR Ser Ql: NONREACTIVE

## 2022-04-26 LAB — CYTOLOGY - PAP
Comment: NEGATIVE
Diagnosis: UNDETERMINED — AB
High risk HPV: NEGATIVE

## 2022-11-15 ENCOUNTER — Ambulatory Visit (INDEPENDENT_AMBULATORY_CARE_PROVIDER_SITE_OTHER): Payer: Medicaid Other | Admitting: Family

## 2022-11-15 ENCOUNTER — Encounter: Payer: Self-pay | Admitting: Family

## 2022-11-15 ENCOUNTER — Other Ambulatory Visit (HOSPITAL_COMMUNITY)
Admission: RE | Admit: 2022-11-15 | Discharge: 2022-11-15 | Disposition: A | Discharge status: Home / Self Care | Payer: Medicaid Other | Source: Ambulatory Visit | Attending: Family | Admitting: Family

## 2022-11-15 VITALS — BP 122/70 | HR 82 | Temp 98.3°F | Ht <= 58 in | Wt 123.0 lb

## 2022-11-15 DIAGNOSIS — F411 Generalized anxiety disorder: Secondary | ICD-10-CM | POA: Diagnosis not present

## 2022-11-15 DIAGNOSIS — R87610 Atypical squamous cells of undetermined significance on cytologic smear of cervix (ASC-US): Secondary | ICD-10-CM | POA: Insufficient documentation

## 2022-11-15 DIAGNOSIS — Z3041 Encounter for surveillance of contraceptive pills: Secondary | ICD-10-CM | POA: Diagnosis not present

## 2022-11-15 MED ORDER — TRI-SPRINTEC 0.18/0.215/0.25 MG-35 MCG PO TABS: 1.0000 | ORAL_TABLET | Freq: Every day | ORAL | 3 refills | Status: DC

## 2022-11-15 NOTE — Assessment & Plan Note (Signed)
Stable Continue celexa 20 mg once daily

## 2022-11-15 NOTE — Assessment & Plan Note (Signed)
Repeat pap done today for insurance purposes.  Thin prep obtained and pending results.

## 2022-11-15 NOTE — Progress Notes (Signed)
   Subjective:  Patient ID: Tamara Ramsey, female    DOB: 1995-07-26  Age: 27 y.o. MRN: [id number]  Patient Care Team: Mort Sawyers, FNP as PCP - General (Family Medicine)   CC:  Chief Complaint  Patient presents with   Repeat Pap    Pt changed jobs and her insurance denied her due to abnormal Pap in December. Needs new Pap to follow-up.    HPI Tamara Ramsey is a 27 y.o. female who presents today for a repeat pap.   Pt is without acute concerns.  She does state that at her last pap 04/19/22 was with ASCUS so she is trying to apply for new insurance, but was denied due to ASCUS last pap and needs repeat. Denies vaginal discharge, and or vaginal pain with sex. Menses normal regular, on OCP and regulates it pretty well.   GAD: on celexa 20 mg once daily, doing well with this.    DEPRESSION SCREENING    04/19/2022    2:03 PM  PHQ 2/9 Scores  PHQ - 2 Score 0     ROS: Negative unless specifically indicated above in HPI.    Current Outpatient Medications:    citalopram (CELEXA) 20 MG tablet, Take 1 tablet (20 mg total) by mouth daily., Disp: 90 tablet, Rfl: 3   fluticasone (FLONASE) 50 MCG/ACT nasal spray, Place 1 spray into both nostrils daily., Disp: , Rfl:    TRI-SPRINTEC 0.18/0.215/0.25 MG-35 MCG tablet, Take 1 tablet by mouth daily., Disp: 90 tablet, Rfl: 3    Objective:    BP 122/70 (BP Location: Left Arm, Patient Position: Sitting, Cuff Size: Normal)   Pulse 82   Temp 98.3 F (36.8 C)   Ht 4\' 10"  (1.473 m)   Wt 123 lb (55.8 kg)   LMP 10/29/2022 (Exact Date)   SpO2 99%   BMI 25.71 kg/m   BP Readings from Last 3 Encounters:  11/15/22 122/70  04/19/22 122/78  03/24/20 120/80      @PHYSEXAMBYAGE @  Gen: NAD, resting comfortably Breasts: breasts appear normal, no suspicious masses, no skin or nipple changes or axillary nodes Physical Exam Genitourinary:    General: Normal vulva.     Pubic Area: No rash.      Labia:        Right: No rash, tenderness,  lesion or injury.        Left: No rash, tenderness, lesion or injury.      Urethra: No prolapse or urethral pain.     Vagina: Normal. No vaginal discharge, tenderness or lesions.     Cervix: No discharge.     Rectum: Normal.  Psych: Normal affect and thought content      Assessment & Plan:  ASCUS of cervix with negative high risk HPV Assessment & Plan: Repeat pap done today for insurance purposes.  Thin prep obtained and pending results.    Orders: -     Cytology - PAP  Encounter for surveillance of contraceptive pills -     Tri-Sprintec; Take 1 tablet by mouth daily.  Dispense: 90 tablet; Refill: 3  GAD (generalized anxiety disorder) Assessment & Plan: Stable Continue celexa 20 mg once daily       Follow-up: Return in about 1 year (around 11/15/2023) for f/u CPE.   Mort Sawyers, FNP

## 2022-11-18 LAB — CYTOLOGY - PAP
Comment: NEGATIVE
Diagnosis: UNDETERMINED — AB
High risk HPV: NEGATIVE

## 2022-11-21 ENCOUNTER — Telehealth: Payer: Self-pay | Admitting: Family

## 2022-11-21 NOTE — Telephone Encounter (Signed)
Patient is requesting a phone call to discuss the results of her pap smear done on 11/15/2022

## 2022-11-21 NOTE — Telephone Encounter (Signed)
Called patient reviewed all information regarding Abnormal PAP per lab comment.  Patient would like to know if there can be more testing done now instead of in a year due to her insurance continuing to deny her.

## 2022-11-21 NOTE — Telephone Encounter (Signed)
Left message to call office

## 2022-11-22 NOTE — Telephone Encounter (Signed)
Please advise pt that actually the recommendation for ASCUS with negative HPV is to repeat the pap in three years. I like to repeat in one year just to verify all is ok and resolution. Patient can see GYN but they will likely tell her the same as these are standard [employer] recommendations.

## 2022-11-23 NOTE — Telephone Encounter (Signed)
Left message to return call to our office.  

## 2022-11-28 NOTE — Telephone Encounter (Signed)
 LMTCB

## 2022-11-29 NOTE — Telephone Encounter (Signed)
Attempted to call, call went straight to voicemail.  Mychart message sent with Tabitha's recommendation.

## 2022-11-29 NOTE — Telephone Encounter (Signed)
Please see MyChart messages.  Pt is concerned because she has been having abnormal paps since 2021.  She wants to talk with you directly.  Do you want me to have her make an appointment or will you call her?

## 2023-03-20 ENCOUNTER — Ambulatory Visit (INDEPENDENT_AMBULATORY_CARE_PROVIDER_SITE_OTHER): Payer: Self-pay | Admitting: Obstetrics & Gynecology

## 2023-03-20 ENCOUNTER — Encounter: Payer: Self-pay | Admitting: Obstetrics & Gynecology

## 2023-03-20 ENCOUNTER — Other Ambulatory Visit (HOSPITAL_COMMUNITY)
Admission: RE | Admit: 2023-03-20 | Discharge: 2023-03-20 | Disposition: A | Discharge status: Home / Self Care | Payer: Medicaid Other | Source: Ambulatory Visit | Attending: Obstetrics & Gynecology | Admitting: Obstetrics & Gynecology

## 2023-03-20 VITALS — BP 132/83 | HR 96 | Ht <= 58 in | Wt 125.7 lb

## 2023-03-20 DIAGNOSIS — Z7689 Persons encountering health services in other specified circumstances: Secondary | ICD-10-CM

## 2023-03-20 DIAGNOSIS — Z8619 Personal history of other infectious and parasitic diseases: Secondary | ICD-10-CM | POA: Diagnosis present

## 2023-03-20 DIAGNOSIS — R87610 Atypical squamous cells of undetermined significance on cytologic smear of cervix (ASC-US): Secondary | ICD-10-CM

## 2023-03-20 NOTE — Progress Notes (Signed)
    GYNECOLOGY PROGRESS NOTE  Subjective:    Patient ID: Tamara Ramsey, female    DOB: 16-Dec-1995, 27 y.o.   MRN: [id number]  HPI  Patient is a 27 y.o. [id number] here for discussion of her pap smears. She had LGSIL in 2021, then ASCUS with negative HR HPV in 2023 and 2024.She is frustrated because she is trying to obtain private insurance and is having difficulty doing so due to her h/o "abnormal pap smears".  She is vaping but is cutting back, has not had Gardasil yet.   The following portions of the patient's history were reviewed and updated as appropriate: allergies, current medications, past family history, past medical history, past social history, past surgical history, and problem list.  Review of Systems Pertinent items are noted in HPI.   Objective:   Height 4\' 9"  (1.448 m), weight 125 lb 11.2 oz (57 kg), last menstrual period 03/16/2023. Body mass index is 27.2 kg/m. Well nourished, well hydrated White female, no apparent distress She is ambulating and conversing normally. EG Speculum exam   Assessment:   1. Establishing care with new doctor, encounter for   2. ASCUS of cervix with negative high risk HPV      Plan:   1. Establishing care with new doctor, encounter for   2. ASCUS of cervix with negative high risk HPV - we had a long discussion about pap smears, cervical cancer, and HPV. We discussed ways to help her immune system in the fight for normal pap smear.  At this time she declines Gardasil  3. H/o + CT in 2021 - TOC today She denies any new sexual partner since STI testing in 2023.  She will need pap smear again around 11/2023

## 2023-03-22 LAB — CERVICOVAGINAL ANCILLARY ONLY
Bacterial Vaginitis (gardnerella): NEGATIVE
Candida Glabrata: NEGATIVE
Candida Vaginitis: NEGATIVE
Chlamydia: NEGATIVE
Comment: NEGATIVE
Comment: NEGATIVE
Comment: NEGATIVE
Comment: NEGATIVE
Comment: NEGATIVE
Comment: NORMAL
Neisseria Gonorrhea: NEGATIVE
Trichomonas: NEGATIVE

## 2023-03-29 ENCOUNTER — Telehealth: Payer: Self-pay

## 2023-03-29 ENCOUNTER — Telehealth: Payer: Self-pay | Admitting: Obstetrics and Gynecology

## 2023-03-29 NOTE — Telephone Encounter (Signed)
Patient called earlier this morning to speak to the Triage to get help with some results.  Patient stated that she got a note back stating to review the notes.  Patient stated that was the reason for her call for someone to help her understand her lab results.  Please give patient a call back

## 2023-03-29 NOTE — Telephone Encounter (Signed)
Pt calling; was seen Monday regarding abnl pap smears she has seen her results and no comments.  [phone]  Left msg for pt to look in her mychart for Dr. Ellin Saba note.

## 2023-03-29 NOTE — Telephone Encounter (Signed)
Spoke with patient and advised her of cervicovaginal swab, patient was advised pap smear was not collected and was advised by dr. Marice Potter that pap smear would be due 11/2023. KW

## 2023-05-04 DIAGNOSIS — F411 Generalized anxiety disorder: Secondary | ICD-10-CM | POA: Diagnosis not present

## 2023-05-04 DIAGNOSIS — Z8742 Personal history of other diseases of the female genital tract: Secondary | ICD-10-CM | POA: Diagnosis not present

## 2023-06-08 DIAGNOSIS — F411 Generalized anxiety disorder: Secondary | ICD-10-CM | POA: Diagnosis not present

## 2023-08-30 DIAGNOSIS — J069 Acute upper respiratory infection, unspecified: Secondary | ICD-10-CM | POA: Diagnosis not present

## 2023-10-11 ENCOUNTER — Other Ambulatory Visit: Payer: Self-pay | Admitting: Family

## 2023-10-11 DIAGNOSIS — Z3041 Encounter for surveillance of contraceptive pills: Secondary | ICD-10-CM

## 2023-12-26 DIAGNOSIS — Z3041 Encounter for surveillance of contraceptive pills: Secondary | ICD-10-CM | POA: Diagnosis not present

## 2023-12-26 DIAGNOSIS — F411 Generalized anxiety disorder: Secondary | ICD-10-CM | POA: Diagnosis not present

## 2024-04-08 DIAGNOSIS — Z8742 Personal history of other diseases of the female genital tract: Secondary | ICD-10-CM | POA: Diagnosis not present

## 2024-04-08 DIAGNOSIS — Z124 Encounter for screening for malignant neoplasm of cervix: Secondary | ICD-10-CM | POA: Diagnosis not present

## 2024-04-08 DIAGNOSIS — L680 Hirsutism: Secondary | ICD-10-CM | POA: Diagnosis not present

## 2024-04-08 DIAGNOSIS — F411 Generalized anxiety disorder: Secondary | ICD-10-CM | POA: Diagnosis not present

## 2024-04-08 DIAGNOSIS — Z0000 Encounter for general adult medical examination without abnormal findings: Secondary | ICD-10-CM | POA: Diagnosis not present

## 2024-04-08 DIAGNOSIS — Z01419 Encounter for gynecological examination (general) (routine) without abnormal findings: Secondary | ICD-10-CM | POA: Diagnosis not present
